# Patient Record
Sex: Female | Born: 1935 | Race: White | Hispanic: No
Health system: Southern US, Community
[De-identification: ages and names within clinical notes are randomized; demographics above are authoritative.]

---

## 2019-10-10 ENCOUNTER — Other Ambulatory Visit: Payer: Self-pay

## 2019-10-10 ENCOUNTER — Emergency Department: Payer: Medicare Other

## 2019-10-10 DIAGNOSIS — G459 Transient cerebral ischemic attack, unspecified: Secondary | ICD-10-CM | POA: Diagnosis not present

## 2019-10-10 DIAGNOSIS — Z79899 Other long term (current) drug therapy: Secondary | ICD-10-CM | POA: Diagnosis not present

## 2019-10-10 DIAGNOSIS — I10 Essential (primary) hypertension: Secondary | ICD-10-CM | POA: Insufficient documentation

## 2019-10-10 DIAGNOSIS — Z7982 Long term (current) use of aspirin: Secondary | ICD-10-CM | POA: Diagnosis not present

## 2019-10-10 DIAGNOSIS — R4701 Aphasia: Secondary | ICD-10-CM | POA: Diagnosis present

## 2019-10-10 LAB — CBC WITH DIFFERENTIAL/PLATELET
Abs Immature Granulocytes: 0.01 10*3/uL (ref 0.00–0.07)
Basophils Absolute: 0.1 10*3/uL (ref 0.0–0.1)
Basophils Relative: 1 %
Eosinophils Absolute: 0.3 10*3/uL (ref 0.0–0.5)
Eosinophils Relative: 3 %
HCT: 42.5 % (ref 36.0–46.0)
Hemoglobin: 15.1 g/dL — ABNORMAL HIGH (ref 12.0–15.0)
Immature Granulocytes: 0 %
Lymphocytes Relative: 24 %
Lymphs Abs: 1.9 10*3/uL (ref 0.7–4.0)
MCH: 32.2 pg (ref 26.0–34.0)
MCHC: 35.5 g/dL (ref 30.0–36.0)
MCV: 90.6 fL (ref 80.0–100.0)
Monocytes Absolute: 0.6 10*3/uL (ref 0.1–1.0)
Monocytes Relative: 7 %
Neutro Abs: 5.2 10*3/uL (ref 1.7–7.7)
Neutrophils Relative %: 65 %
Platelets: 202 10*3/uL (ref 150–400)
RBC: 4.69 MIL/uL (ref 3.87–5.11)
RDW: 12.7 % (ref 11.5–15.5)
WBC: 8 10*3/uL (ref 4.0–10.5)
nRBC: 0 % (ref 0.0–0.2)

## 2019-10-10 LAB — COMPREHENSIVE METABOLIC PANEL
ALT: 24 U/L (ref 0–44)
AST: 28 U/L (ref 15–41)
Albumin: 4.4 g/dL (ref 3.5–5.0)
Alkaline Phosphatase: 70 U/L (ref 38–126)
Anion gap: 10 (ref 5–15)
BUN: 23 mg/dL (ref 8–23)
CO2: 25 mmol/L (ref 22–32)
Calcium: 9.8 mg/dL (ref 8.9–10.3)
Chloride: 98 mmol/L (ref 98–111)
Creatinine, Ser: 0.68 mg/dL (ref 0.44–1.00)
GFR calc Af Amer: >60 mL/min (ref >60)
GFR calc non Af Amer: >60 mL/min (ref >60)
Glucose, Bld: 108 mg/dL — ABNORMAL HIGH (ref 70–99)
Potassium: 3.9 mmol/L (ref 3.5–5.1)
Sodium: 133 mmol/L — ABNORMAL LOW (ref 135–145)
Total Bilirubin: 0.9 mg/dL (ref 0.3–1.2)
Total Protein: 7.8 g/dL (ref 6.5–8.1)

## 2019-10-10 LAB — PROTIME-INR
INR: 0.9 (ref 0.8–1.2)
Prothrombin Time: 12.2 seconds (ref 11.4–15.2)

## 2019-10-10 LAB — TROPONIN I (HIGH SENSITIVITY): Troponin I (High Sensitivity): 9 ng/L (ref <18)

## 2019-10-10 LAB — APTT: aPTT: 26 seconds (ref 24–36)

## 2019-10-10 LAB — GLUCOSE, CAPILLARY: Glucose-Capillary: 93 mg/dL (ref 70–99)

## 2019-10-10 NOTE — ED Triage Notes (Signed)
Pt states around 1900 today she had episode where she had some expressive aphasia, states felt like her thoughts were disorganized. States symptoms are better but states still feels like she is not back to baseline. Pt is able to speak in complete sentences and answer questions appropriately. Hx of TIA years ago. PT denies any weakness or numbness.

## 2019-10-11 ENCOUNTER — Emergency Department
Admission: EM | Admit: 2019-10-11 | Discharge: 2019-10-11 | Disposition: A | Discharge status: Home / Self Care | Payer: Medicare Other | Attending: Emergency Medicine | Admitting: Emergency Medicine

## 2019-10-11 ENCOUNTER — Emergency Department: Payer: Medicare Other

## 2019-10-11 DIAGNOSIS — G459 Transient cerebral ischemic attack, unspecified: Secondary | ICD-10-CM | POA: Diagnosis not present

## 2019-10-11 LAB — URINALYSIS, COMPLETE (UACMP) WITH MICROSCOPIC
Bilirubin Urine: NEGATIVE
Glucose, UA: NEGATIVE mg/dL
Hgb urine dipstick: NEGATIVE
Ketones, ur: NEGATIVE mg/dL
Leukocytes,Ua: NEGATIVE
Nitrite: NEGATIVE
Protein, ur: NEGATIVE mg/dL
Specific Gravity, Urine: 1.013 (ref 1.005–1.030)
pH: 6 (ref 5.0–8.0)

## 2019-10-11 NOTE — ED Notes (Signed)
Patient transported to MRI 

## 2019-10-11 NOTE — ED Notes (Signed)
Computer in room not working. Unable to obtain discharge signature. Patient and daughter verbalized understanding of discharge instructions and follow-up care. Patient ambulatory to lobby with steady gait and NAD noted.

## 2019-10-11 NOTE — ED Provider Notes (Signed)
Procedures     ----------------------------------------- 9:29 AM on 10/11/2019 ----------------------------------------- Assumed care from Dr. Don Perking with a plan to follow-up on MRI.  Per her discussions with the patient and family, if MRI negative patient would be discharged home to follow-up with PCP, and if positive for stroke patient would be admitted for further work-up.  MRI is unremarkable without signs of stroke.  Will discharge home.Sharman Cheek, MD 10/11/19 [phone]

## 2019-10-11 NOTE — ED Provider Notes (Signed)
[facility] Emergency Department Provider Note  ____________________________________________  Time seen: Approximately 4:31 AM  I have reviewed the triage vital signs and the nursing notes.   HISTORY  Chief Complaint Transient Ischemic Attack   HPI Tara Hutchinson is a 86 y.o. female with a history of hypertension, hyperlipidemia, osteoarthritis, IBS who presents to the hospital who presents for an episode of transient expressive aphasia.  Patient is here visiting her daughter for her birthday  in 2 days.  Patient is very independent, has no dementia, lives alone, still drives her car.  Around 7 PM she was talking to her daughter when suddenly she became confused and was unable to express herself.  Patient's insight was intact and she knew that she was confused and having difficulty coming up with words that she wanted to use.  The episode lasted 30 to 45 minutes. Her BP was elevated.  According to patient and her daughter she had a similar episode about a decade ago and she was told she had a TIA.  She is currently on a baby aspirin 3 times a week since having a small episode of GI bleed when she was on it daily.  She has no history of A. fib.  She reports having a mild frontal headache after this episode.  She has been back to baseline for the last 9 hours that she has been in the waiting room.  No facial droop, no unilateral weakness or numbness, no chest pain.  Patient took a full dose aspirin.  PMH Hypertension Hyperlipidemia Irritable bowel syndrome Osteoarthritis  Prior to Admission medications   Medication Sig Start Date End Date Taking? Authorizing Provider  amLODipine (NORVASC) 10 MG tablet Take 10 mg by mouth daily.  07/22/19   [provider]  aspirin 81 MG EC tablet Take 81 mg by mouth daily.     [provider]  Calcium Carbonate-Vitamin D 600-200 MG-UNIT TABS Take 1 tablet by mouth daily.     [provider]  celecoxib  (CELEBREX) 200 MG capsule Take 200 mg by mouth daily. 07/22/19   [provider]  folic acid (FOLVITE) 400 MCG tablet Take 400 mcg by mouth at bedtime.     [provider]  glucosamine-chondroitin 500-400 MG tablet Take 1 tablet by mouth 3 (three) times daily.     [provider]  losartan (COZAAR) 100 MG tablet Take 100 mg by mouth daily. 07/22/19   [provider]  metoprolol succinate (TOPROL-XL) 50 MG 24 hr tablet Take 50 mg by mouth daily. 08/31/19   [provider]  Multiple Vitamin (MULTIVITAMIN) capsule Take 1 capsule by mouth daily.     [provider]  Omega-3 1000 MG CAPS Take 1 capsule by mouth daily.     [provider]  simvastatin (ZOCOR) 20 MG tablet Take 20 mg by mouth at bedtime. 07/22/19   [provider]    Allergies Cefuroxime axetil, Lisinopril, Other, Oxycodone-acetaminophen, Azithromycin, Clarithromycin, Codeine, Levofloxacin, and Sulfamethoxazole  No family history on file.  Social History Social History   Tobacco Use  . Smoking status: Not on file  Substance Use Topics  . Alcohol use: Not on file  . Drug use: Not on file    Review of Systems  Constitutional: Negative for fever. Eyes: Negative for visual changes. ENT: Negative for sore throat. Neck: No neck pain  Cardiovascular: Negative for chest pain. Respiratory: Negative for shortness of breath. Gastrointestinal: Negative for abdominal pain, vomiting or diarrhea.  Genitourinary: Negative for dysuria. Musculoskeletal: Negative for back pain. Skin: Negative for rash. Neurological: Negative for weakness or numbness. + HA and difficulty finding words Psych: No SI or HI  ____________________________________________   PHYSICAL EXAM:  VITAL SIGNS: Vitals:   10/11/19 0010 10/11/19 0341  BP: (!) 162/77 (!) 170/72  Pulse: 60 63  Resp: 18 19  Temp: 98.5 F (36.9 C)   SpO2: 97% 99%   Constitutional: Alert and oriented. Well  appearing and in no apparent distress. HEENT:      Head: Normocephalic and atraumatic.         Eyes: Conjunctivae are normal. Sclera is non-icteric.       Mouth/Throat: Mucous membranes are moist.       Neck: Supple with no signs of meningismus. Cardiovascular: Regular rate and rhythm. No murmurs, gallops, or rubs.  Respiratory: Normal respiratory effort. Lungs are clear to auscultation bilaterally. No wheezes, crackles, or rhonchi.  Gastrointestinal: Soft, non tender Musculoskeletal: No edema, cyanosis, or erythema of extremities. Neurologic: Normal speech and language. Face is symmetric. EOMI, PERRL.  Strength and sensation x4 is intact, no pronator drift, no dysmetria , normal gait. Skin: Skin is warm, dry and intact. No rash noted. Psychiatric: Mood and affect are normal. Speech and behavior are normal.  ____________________________________________   LABS (all labs ordered are listed, but only abnormal results are displayed)  Labs Reviewed  CBC WITH DIFFERENTIAL/PLATELET - Abnormal; Notable for the following components:      Result Value   Hemoglobin 15.1 (*)    All other components within normal limits  COMPREHENSIVE METABOLIC PANEL - Abnormal; Notable for the following components:   Sodium 133 (*)    Glucose, Bld 108 (*)    All other components within normal limits  URINALYSIS, COMPLETE (UACMP) WITH MICROSCOPIC - Abnormal; Notable for the following components:   Color, Urine YELLOW (*)    APPearance HAZY (*)    All other components within normal limits  PROTIME-INR  APTT  GLUCOSE, CAPILLARY  CBG MONITORING, ED  TROPONIN I (HIGH SENSITIVITY)   ____________________________________________  EKG  ED ECG REPORT I, Nita Sickle, the attending physician, personally viewed and interpreted this ECG.  Normal sinus rhythm, rate of 70, normal intervals, normal axis, no ST elevations or depressions.  Normal  EKG. ____________________________________________  RADIOLOGY  I have personally reviewed the images performed during this visit and I agree with the Radiologist's read.   Interpretation by Radiologist:  CT Head Wo Contrast  Result Date: 10/10/2019 CLINICAL DATA:  TIA EXAM: CT HEAD WITHOUT CONTRAST TECHNIQUE: Contiguous axial images were obtained from the base of the skull through the vertex without intravenous contrast. COMPARISON:  None. FINDINGS: Brain: There is atrophy and chronic small vessel disease changes. Old bilateral basal ganglia lacunar infarcts. No acute intracranial abnormality. Specifically, no hemorrhage, hydrocephalus, mass lesion, acute infarction, or significant intracranial injury. Vascular: No hyperdense vessel or unexpected calcification. Skull: No acute calvarial abnormality. Sinuses/Orbits: Visualized paranasal sinuses and mastoids clear. Orbital soft tissues unremarkable. Other: None IMPRESSION: Atrophy, chronic microvascular disease. No acute intracranial abnormality. Electronically Signed   By: Charlett Nose M.D.   On: 10/10/2019 20:32     ____________________________________________   PROCEDURES  Procedure(s) performed: None Procedures Critical Care performed:  None ____________________________________________   INITIAL IMPRESSION / ASSESSMENT AND PLAN / ED COURSE  86 y.o. female with a history of hypertension, hyperlipidemia, osteoarthritis, IBS who presents to the hospital who presents for an episode of transient expressive aphasia lasting about 45 minutes that  happened 9 hours ago.  Patient has been back to baseline since then.  NIHSS 0.  EKG with no signs of A. fib or dysrhythmias.  Patient is on telemetry showing no signs of dysrhythmias.  BP slightly elevated.  Patient is afebrile.  No meningeal signs.  Labs reviewed by me showing normal glucose, no significant electrolyte abnormalities, no dehydration, no anemia or leukocytosis.  CT head visualize and  interpreted by me as normal.  Presentation concerning for TIA versus possible brief episode of confusion in the setting of very elevated blood pressure.  Patient took a full aspirin at home.  We will get an MRI.  History was gathered from patient and her daughter who was at bedside and present during this episode.  Patient and daughter would like to go home if MRI is negative.  We discussed pros and cons of being admitted for a TIA work-up versus close outpatient follow-up.  I discussed with patient and daughter the increased risk for a stroke within 7 days of a TIA.  However due to the pandemic and the fact that is patient's birthday this weekend, patient and daughter agree that if the MRI is negative they prefer to go home and follow-up with her doctor on Monday.  I think that is reasonable.  _________________________ 7:13 AM on 10/11/2019 -----------------------------------------  MRI pending.  Care transferred to Dr. Joni Fears.    _____________________________________________ Please note:  Patient was evaluated in Emergency Department today for the symptoms described in the history of present illness. Patient was evaluated in the context of the global COVID-19 pandemic, which necessitated consideration that the patient might be at risk for infection with the SARS-CoV-2 virus that causes COVID-19. Institutional protocols and algorithms that pertain to the evaluation of patients at risk for COVID-19 are in a state of rapid change based on information released by regulatory bodies including the CDC and federal and state organizations. These policies and algorithms were followed during the patient's care in the ED.  Some ED evaluations and interventions may be delayed as a result of limited staffing during the pandemic.   [place] Controlled Substance Database was reviewed by me. ____________________________________________   FINAL CLINICAL IMPRESSION(S) / ED DIAGNOSES   Final diagnoses:  TIA  (transient ischemic attack)      NEW MEDICATIONS STARTED DURING THIS VISIT:  ED Discharge Orders    None       Note:  This document was prepared using Dragon voice recognition software and may include unintentional dictation errors.    Alfred Levins, [place], MD 10/11/19 [phone]

## 2019-10-11 NOTE — Discharge Instructions (Addendum)
Your CT scan and MRI of the brain today were both unremarkable.  We don't see any signs of stroke at this time. Please follow up with your primary care doctor within the next week for further evaluation of these symptoms.  Continue taking all of your home medications as prescribed.

## 2020-12-30 IMAGING — MR MR HEAD W/O CM
10 of 11 series · 40 of 48 positions shown · non-contrast
Comparison: None.

CLINICAL DATA: Confusion, speech disturbance

EXAM:
MRI HEAD WITHOUT CONTRAST
TECHNIQUE: Multiplanar, multiecho pulse sequences of the brain and surrounding
structures were obtained without intravenous contrast.

[Series 5: ax dwi_tracew · axial · 3.0mm · 0.60mm/px · z∈[-55,+99]mm · 6 of 96 slices shown]
[im 1/96]
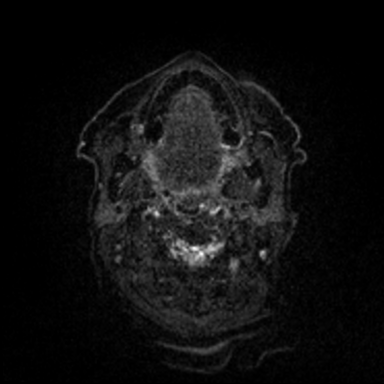
[im 20/96]
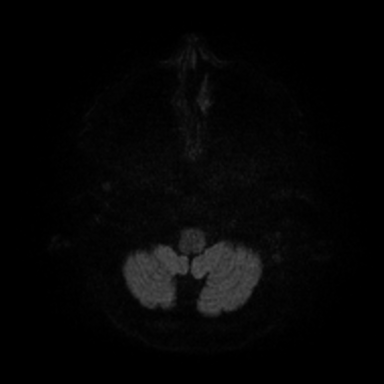
[im 39/96]
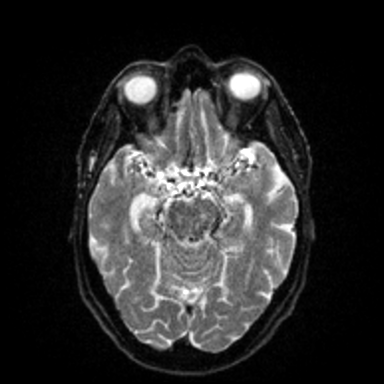
[im 58/96]
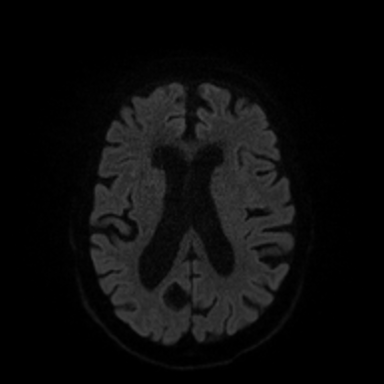
[im 77/96]
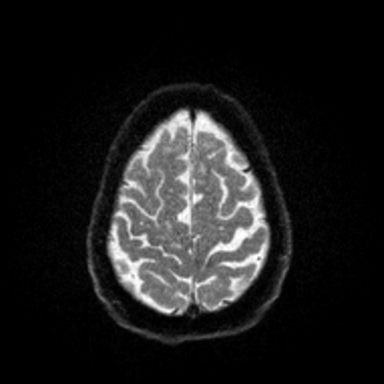
[im 96/96]
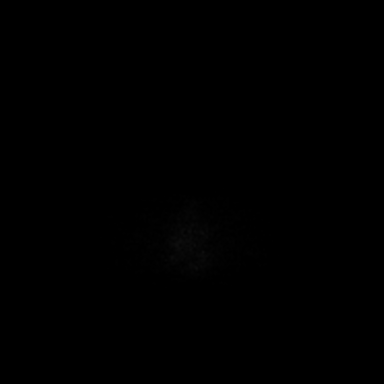

[Series 6: ax dwi_adc · axial · 3.0mm · 0.60mm/px · z∈[-55,+99]mm · 3 of 48 slices shown]
[im 1/48]
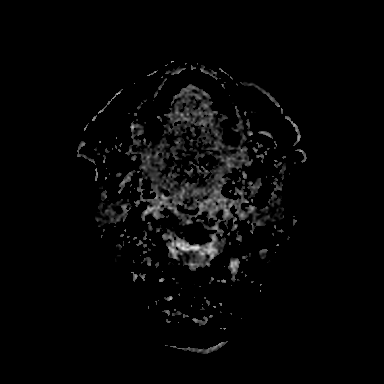
[im 24/48]
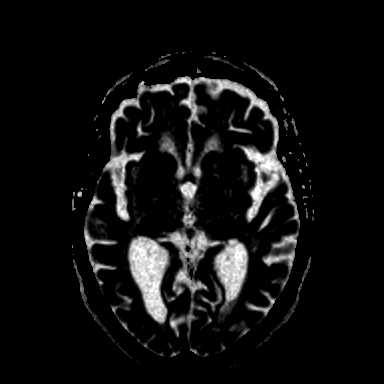
[im 48/48]
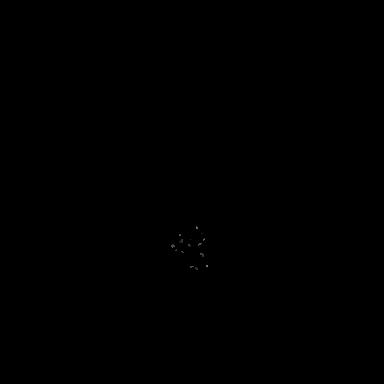

[Series 7: cor dwi_tracew · coronal · 5.0mm · 0.60mm/px · 6 of 80 slices shown]
[im 1/80]
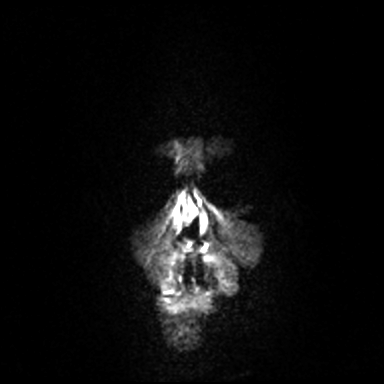
[im 16/80]
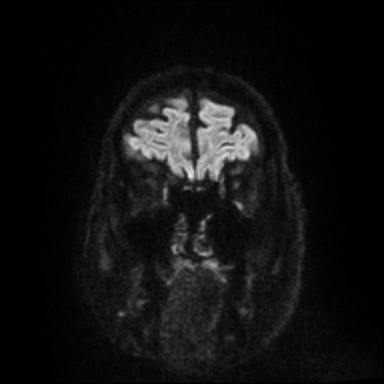
[im 32/80]
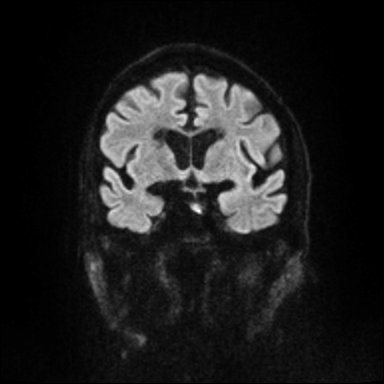
[im 48/80]
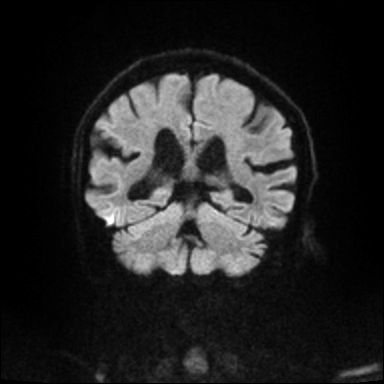
[im 64/80]
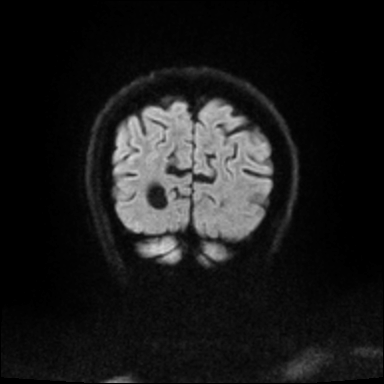
[im 80/80]
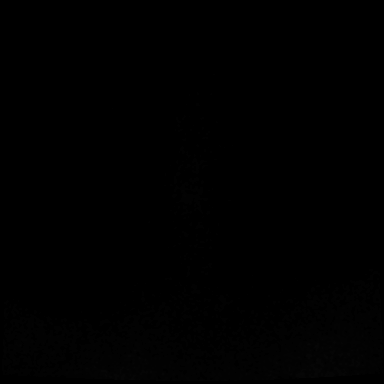

[Series 8: cor dwi_adc · coronal · 5.0mm · 0.60mm/px · 3 of 39 slices shown]
[im 1/39]
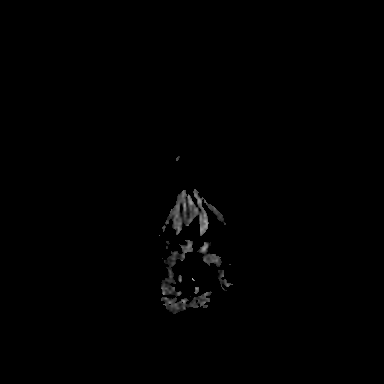
[im 20/39]
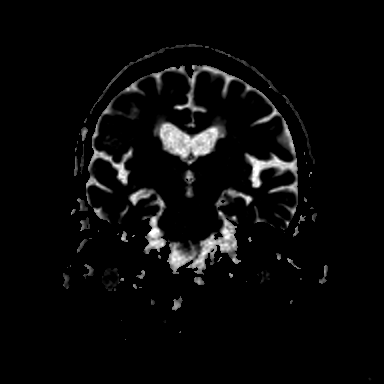
[im 39/39]
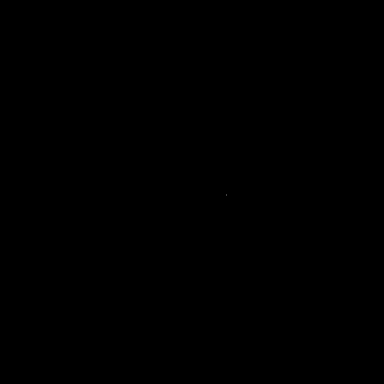

[Series 9: T1 · sagittal · 5.0mm · 0.62mm/px · 2 of 23 slices shown (1 of 2)]
[im 1/23]
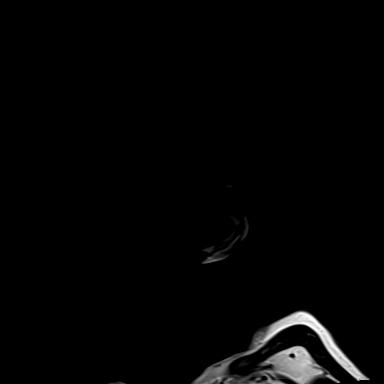
[im 23/23]
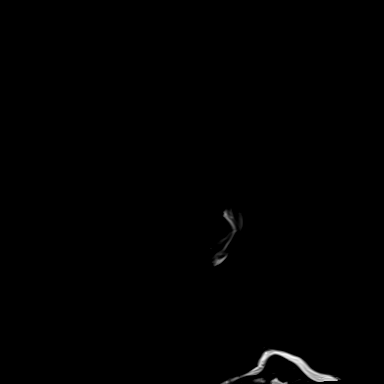

[Series 10: T2 · axial · 5.0mm · 0.53mm/px · z∈[-47,+95]mm · 2 of 25 slices shown (1 of 2)]
[im 1/25]
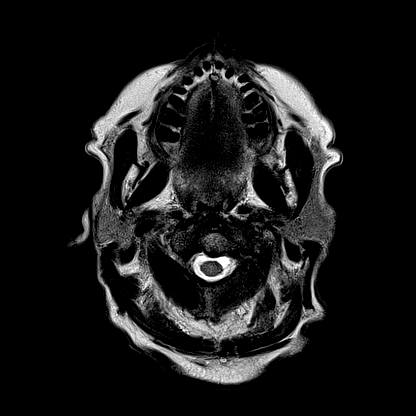
[im 25/25]
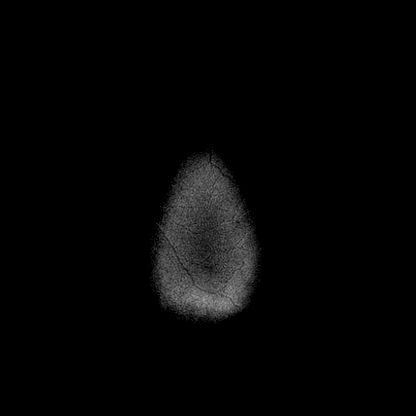

[Series 12: pha_images · axial · 3.0mm · 0.90mm/px · z∈[-64,+105]mm · 4 of 58 slices shown]
[im 1/58]
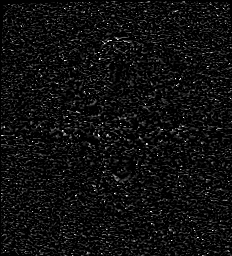
[im 20/58]
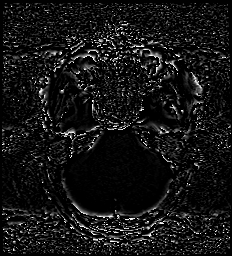
[im 39/58]
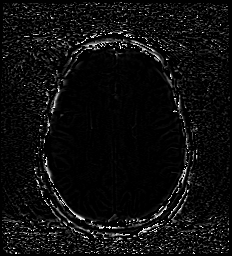
[im 58/58]
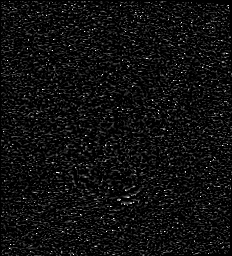

[Series 15: FLAIR · axial · 3.0mm · 0.53mm/px · z∈[-56,+104]mm · 4 of 55 slices shown]
[im 1/55]
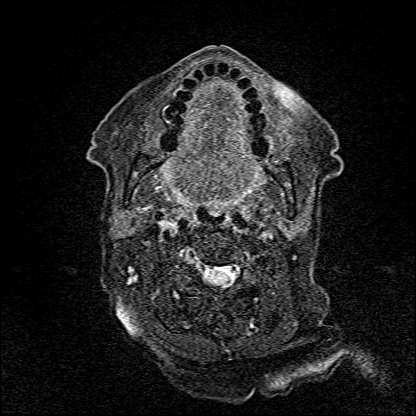
[im 19/55]
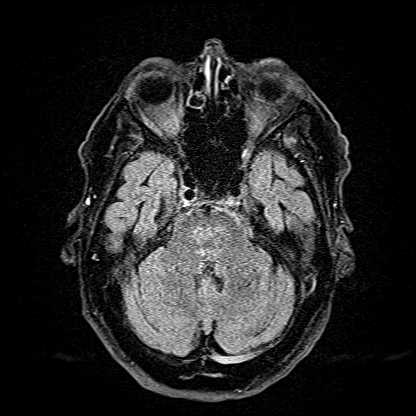
[im 37/55]
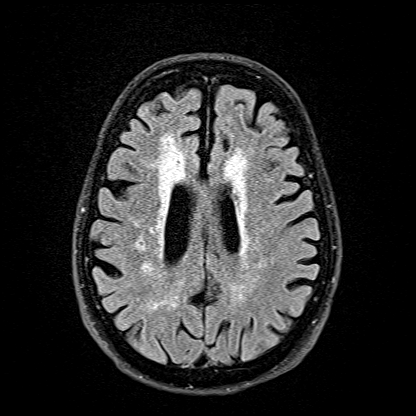
[im 55/55]
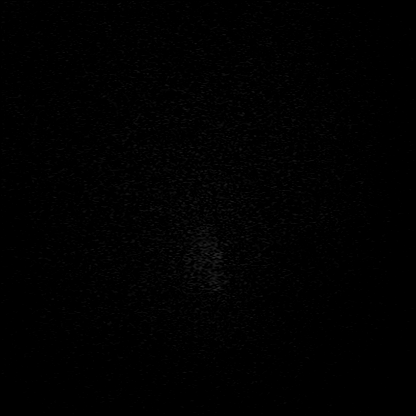

[Series 16: T1 · axial · 1.0mm · 0.98mm/px · z∈[-61,+112]mm · 8 of 176 slices shown (2 of 2)]
[im 1/176]
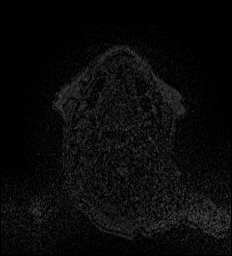
[im 32/176]
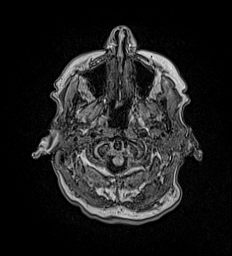
[im 48/176]
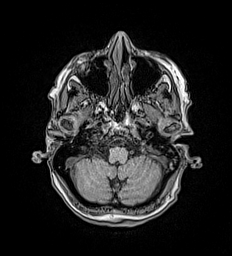
[im 80/176]
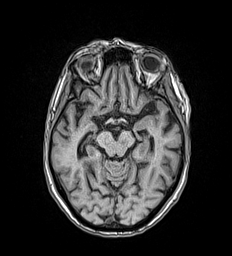
[im 96/176]
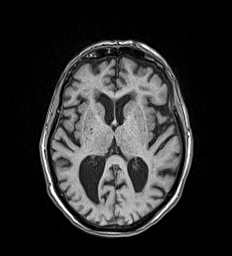
[im 128/176]
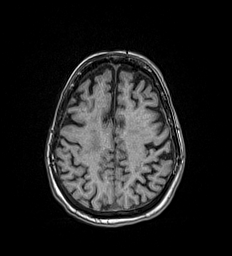
[im 144/176]
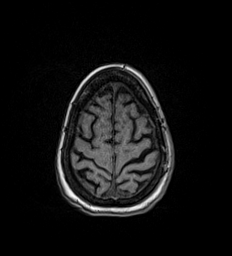
[im 176/176]
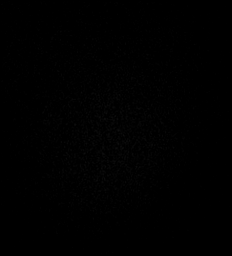

[Series 17: T2 · coronal · 5.0mm · 0.57mm/px · 2 of 29 slices shown (2 of 2)]
[im 1/29]
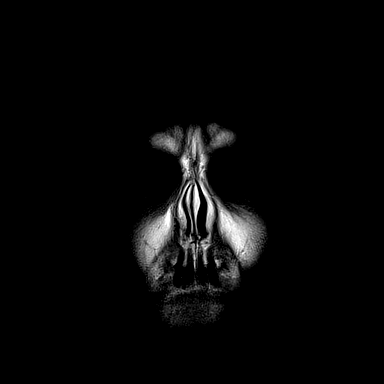
[im 29/29]
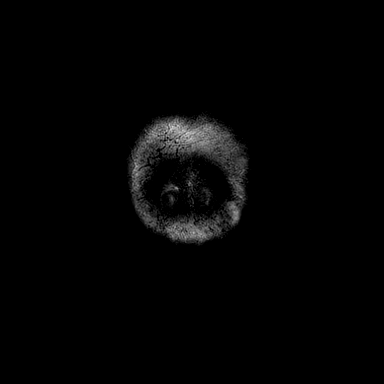

[40 of 48 positions shown; findings below may reference images not displayed]

FINDINGS: Brain: There is no acute infarction or intracranial hemorrhage.
There is no intracranial mass, mass effect, or edema. There is no
hydrocephalus or extra-axial fluid collection. Patchy and confluent
T2 hyperintensity in the supratentorial and pontine white matter is
nonspecific but probably reflects moderate chronic microvascular
ischemic changes. Prominence of the ventricles and sulci reflects
generalized parenchymal volume loss.

Vascular: Major vessel flow voids at the skull base are preserved.

Skull and upper cervical spine: Normal marrow signal is preserved.

Sinuses/Orbits: Minor mucosal thickening.  Orbits are unremarkable.

Other: Sella is unremarkable.  Patchy mastoid fluid opacification.
IMPRESSION: No evidence of recent infarction, hemorrhage, or mass.

Moderate chronic microvascular ischemic changes.
